# Patient Record
Sex: Female | Born: 1975 | Race: Black or African American | Hispanic: No | Marital: Single | Smoking: Never smoker
Health system: Southern US, Community
[De-identification: ages and names within clinical notes are randomized; demographics above are authoritative.]

## PROBLEM LIST (undated history)

## (undated) DIAGNOSIS — J45909 Unspecified asthma, uncomplicated: Secondary | ICD-10-CM

## (undated) HISTORY — PX: TUBAL LIGATION: SHX77

---

## 1997-09-10 ENCOUNTER — Other Ambulatory Visit: Admission: RE | Admit: 1997-09-10 | Discharge: 1997-09-10 | Payer: Self-pay | Admitting: Obstetrics

## 1998-02-05 ENCOUNTER — Emergency Department (HOSPITAL_COMMUNITY): Admission: EM | Admit: 1998-02-05 | Discharge: 1998-02-05 | Payer: Self-pay | Admitting: Emergency Medicine

## 1998-06-24 ENCOUNTER — Other Ambulatory Visit: Admission: RE | Admit: 1998-06-24 | Discharge: 1998-06-24 | Payer: Self-pay | Admitting: Obstetrics

## 1998-12-09 ENCOUNTER — Other Ambulatory Visit: Admission: RE | Admit: 1998-12-09 | Discharge: 1998-12-09 | Payer: Self-pay | Admitting: Obstetrics

## 1999-01-12 ENCOUNTER — Other Ambulatory Visit: Admission: RE | Admit: 1999-01-12 | Discharge: 1999-01-12 | Payer: Self-pay

## 1999-12-28 ENCOUNTER — Other Ambulatory Visit: Admission: RE | Admit: 1999-12-28 | Discharge: 1999-12-28 | Payer: Self-pay | Admitting: Obstetrics

## 2000-10-27 ENCOUNTER — Inpatient Hospital Stay (HOSPITAL_COMMUNITY): Admission: AD | Admit: 2000-10-27 | Discharge: 2000-10-27 | Payer: Self-pay | Admitting: Obstetrics

## 2001-03-02 ENCOUNTER — Emergency Department (HOSPITAL_COMMUNITY): Admission: EM | Admit: 2001-03-02 | Discharge: 2001-03-02 | Payer: Self-pay | Admitting: Emergency Medicine

## 2001-03-08 ENCOUNTER — Emergency Department (HOSPITAL_COMMUNITY): Admission: EM | Admit: 2001-03-08 | Discharge: 2001-03-09 | Payer: Self-pay | Admitting: Emergency Medicine

## 2001-04-05 ENCOUNTER — Ambulatory Visit (HOSPITAL_COMMUNITY): Admission: RE | Admit: 2001-04-05 | Discharge: 2001-04-05 | Payer: Self-pay | Admitting: *Deleted

## 2001-05-07 ENCOUNTER — Encounter: Payer: Self-pay | Admitting: Obstetrics

## 2001-05-07 ENCOUNTER — Ambulatory Visit (HOSPITAL_COMMUNITY): Admission: RE | Admit: 2001-05-07 | Discharge: 2001-05-07 | Payer: Self-pay | Admitting: Obstetrics

## 2001-05-24 ENCOUNTER — Inpatient Hospital Stay (HOSPITAL_COMMUNITY): Admission: AD | Admit: 2001-05-24 | Discharge: 2001-05-24 | Payer: Self-pay | Admitting: Obstetrics

## 2001-08-24 ENCOUNTER — Inpatient Hospital Stay (HOSPITAL_COMMUNITY): Admission: AD | Admit: 2001-08-24 | Discharge: 2001-08-24 | Payer: Self-pay | Admitting: Obstetrics

## 2001-08-25 ENCOUNTER — Inpatient Hospital Stay (HOSPITAL_COMMUNITY): Admission: AD | Admit: 2001-08-25 | Discharge: 2001-08-25 | Payer: Self-pay | Admitting: Obstetrics

## 2001-09-16 ENCOUNTER — Inpatient Hospital Stay (HOSPITAL_COMMUNITY): Admission: AD | Admit: 2001-09-16 | Discharge: 2001-09-16 | Payer: Self-pay | Admitting: Obstetrics

## 2001-10-07 ENCOUNTER — Encounter (INDEPENDENT_AMBULATORY_CARE_PROVIDER_SITE_OTHER): Payer: Self-pay | Admitting: Specialist

## 2001-10-07 ENCOUNTER — Inpatient Hospital Stay (HOSPITAL_COMMUNITY): Admission: AD | Admit: 2001-10-07 | Discharge: 2001-10-09 | Payer: Self-pay | Admitting: Obstetrics

## 2003-09-21 ENCOUNTER — Inpatient Hospital Stay (HOSPITAL_COMMUNITY): Admission: AD | Admit: 2003-09-21 | Discharge: 2003-09-21 | Payer: Self-pay | Admitting: Obstetrics

## 2004-07-14 ENCOUNTER — Ambulatory Visit (HOSPITAL_COMMUNITY): Admission: RE | Admit: 2004-07-14 | Discharge: 2004-07-14 | Payer: Self-pay | Admitting: Internal Medicine

## 2004-08-22 ENCOUNTER — Other Ambulatory Visit: Admission: RE | Admit: 2004-08-22 | Discharge: 2004-08-22 | Payer: Self-pay | Admitting: Obstetrics and Gynecology

## 2006-03-08 ENCOUNTER — Ambulatory Visit (HOSPITAL_COMMUNITY): Admission: RE | Admit: 2006-03-08 | Discharge: 2006-03-08 | Payer: Self-pay | Admitting: Pulmonary Disease

## 2006-12-24 ENCOUNTER — Encounter: Admission: RE | Admit: 2006-12-24 | Discharge: 2006-12-24 | Payer: Self-pay | Admitting: Orthopedic Surgery

## 2007-04-30 ENCOUNTER — Emergency Department (HOSPITAL_COMMUNITY): Admission: EM | Admit: 2007-04-30 | Discharge: 2007-04-30 | Payer: Self-pay | Admitting: Emergency Medicine

## 2007-09-06 ENCOUNTER — Emergency Department (HOSPITAL_COMMUNITY): Admission: EM | Admit: 2007-09-06 | Discharge: 2007-09-06 | Payer: Self-pay | Admitting: Emergency Medicine

## 2007-09-25 ENCOUNTER — Emergency Department (HOSPITAL_COMMUNITY): Admission: EM | Admit: 2007-09-25 | Discharge: 2007-09-25 | Payer: Self-pay | Admitting: Emergency Medicine

## 2007-11-05 ENCOUNTER — Encounter: Admission: RE | Admit: 2007-11-05 | Discharge: 2007-11-05 | Payer: Self-pay | Admitting: Orthopedic Surgery

## 2009-11-02 ENCOUNTER — Encounter: Admission: RE | Admit: 2009-11-02 | Discharge: 2009-11-02 | Payer: Self-pay | Admitting: Obstetrics

## 2010-03-04 ENCOUNTER — Emergency Department (HOSPITAL_COMMUNITY): Admission: EM | Admit: 2010-03-04 | Discharge: 2010-03-04 | Payer: Self-pay | Admitting: Emergency Medicine

## 2010-03-11 ENCOUNTER — Encounter: Admission: RE | Admit: 2010-03-11 | Discharge: 2010-03-11 | Payer: Self-pay | Admitting: Orthopedic Surgery

## 2010-03-16 ENCOUNTER — Encounter: Admission: RE | Admit: 2010-03-16 | Discharge: 2010-03-16 | Payer: Self-pay | Admitting: Orthopedic Surgery

## 2010-03-29 ENCOUNTER — Encounter
Admission: RE | Admit: 2010-03-29 | Discharge: 2010-06-11 | Payer: Self-pay | Source: Home / Self Care | Attending: Orthopedic Surgery | Admitting: Orthopedic Surgery

## 2010-05-27 ENCOUNTER — Encounter
Admission: RE | Admit: 2010-05-27 | Discharge: 2010-05-27 | Payer: Self-pay | Source: Home / Self Care | Attending: Pulmonary Disease | Admitting: Pulmonary Disease

## 2010-06-11 ENCOUNTER — Encounter: Admission: RE | Admit: 2010-06-11 | Payer: Self-pay | Source: Home / Self Care | Admitting: Orthopedic Surgery

## 2010-07-02 ENCOUNTER — Encounter: Payer: Self-pay | Admitting: Internal Medicine

## 2010-07-03 ENCOUNTER — Encounter: Payer: Self-pay | Admitting: Pulmonary Disease

## 2010-10-28 NOTE — Discharge Summary (Signed)
[facility]  Patient:    Brandi Meyer, Brandi Meyer Visit Number: [id number] MRN: [id number]          Service Type: OBS Location: 910A 9120 01 Attending Physician:  Venita Sheffield Dictated by:   Kathreen Cosier, M.D. Admit Date:  10/07/2001 Discharge Date: 10/09/2001                             Discharge Summary  HISTORY:                      The patient is a 35 year old gravida 3, para 2, EDC Oct 12, 2001, who was admitted in labor and had a normal vaginal delivery of a female patient.  She desired sterilization on April 29, underwent a postpartum tubal ligation.  She did well and was discharged home on the second postpartum day, April 30, ambulatory on a regular diet on Tylenol #3. Hemoglobin was 11.6.  She is to see me in six weeks.  DISCHARGE DIAGNOSES:          1. Status post normal vaginal delivery at term.                               2. Postpartum tubal ligation. Dictated by:   Kathreen Cosier, M.D. Attending Physician:  Venita Sheffield DD:  10/09/01 TD:  10/10/01 Job: [id number]

## 2010-10-28 NOTE — Op Note (Signed)
[facility]  Patient:    Brandi Meyer, Brandi Meyer Visit Number: [id number] MRN: [id number]          Service Type: OBS Location: 910A 9120 01 Attending Physician:  Venita Sheffield Dictated by:   Kathreen Cosier, M.D. Admit Date:  10/07/2001                             Operative Report  PREOPERATIVE DIAGNOSES:       Multiparity.  PROCEDURE:                    Postpartum tubal ligation.  Under general anesthesia with patient in supine position, abdomen prepped and draped. Bladder emptied with straight catheter.  Midline subumbilical incision 1 inch long made, carried down to the rectus fascia.  Fascia cleaned and incised the length of the incision.  Fascia grasped with two Kochers.  Fascia and peritoneum opened with Mayo scissors.  The right tube was grasped in the mid portion with Babcock clamp and the tube traced to the fimbria.  Plain 0 suture placed in the mesosalpinx below the portion of tube in the clamp.  This was tied and approximately 1 inch of tube transected.  Procedure done in exact fashion on the other side.  Hemostasis satisfactory.  Abdomen closed in layers.  Peritoneum and fascia continuous suture of 0 Dexon.  Skin closed with subcuticular stitch of 3-0 plain.  Blood loss less than 5 cc.  Patient tolerated procedure well.  Taken to recovery room in good condition. Dictated by:   Kathreen Cosier, M.D. Attending Physician:  Venita Sheffield DD:  10/08/01 TD:  10/08/01 Job: [id number]

## 2011-03-21 LAB — CBC
HCT: 33.6 — ABNORMAL LOW
Hemoglobin: 11.4 — ABNORMAL LOW
MCV: 93.1
RDW: 12.4
WBC: 4.5

## 2011-03-21 LAB — DIFFERENTIAL
Basophils Relative: 1
Eosinophils Absolute: 0.1 — ABNORMAL LOW
Eosinophils Relative: 2
Monocytes Absolute: 0.2
Monocytes Relative: 5
Neutrophils Relative %: 71

## 2011-03-21 LAB — BASIC METABOLIC PANEL
BUN: 7
CO2: 28
Calcium: 8.6
Chloride: 104
Creatinine, Ser: 0.8
Glucose, Bld: 98

## 2011-05-25 ENCOUNTER — Other Ambulatory Visit: Payer: Self-pay | Admitting: Obstetrics

## 2011-05-25 DIAGNOSIS — Z1231 Encounter for screening mammogram for malignant neoplasm of breast: Secondary | ICD-10-CM

## 2011-06-19 ENCOUNTER — Ambulatory Visit: Payer: Self-pay

## 2011-06-20 ENCOUNTER — Ambulatory Visit
Admission: RE | Admit: 2011-06-20 | Discharge: 2011-06-20 | Disposition: A | Discharge status: Home / Self Care | Payer: Medicaid Other | Source: Ambulatory Visit | Attending: Obstetrics | Admitting: Obstetrics

## 2011-06-20 DIAGNOSIS — Z1231 Encounter for screening mammogram for malignant neoplasm of breast: Secondary | ICD-10-CM

## 2011-07-30 IMAGING — CR DG LUMBAR SPINE COMPLETE 4+V
5 series · 5 of 5 positions shown · non-contrast
Comparison: None

CLINICAL DATA: Lumbar strain.  Fall 1 week ago.

LUMBAR SPINE - COMPLETE 4+ VIEW

[view not recorded (1 of 5)]
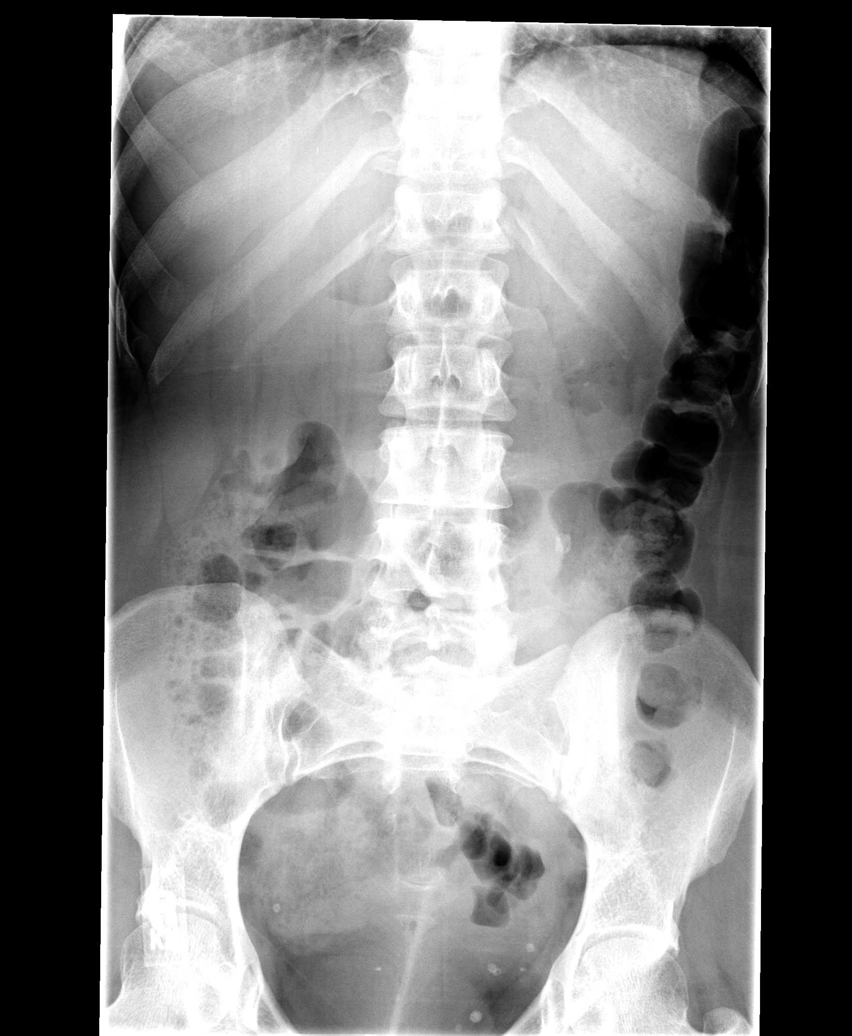

[view not recorded (2 of 5)]
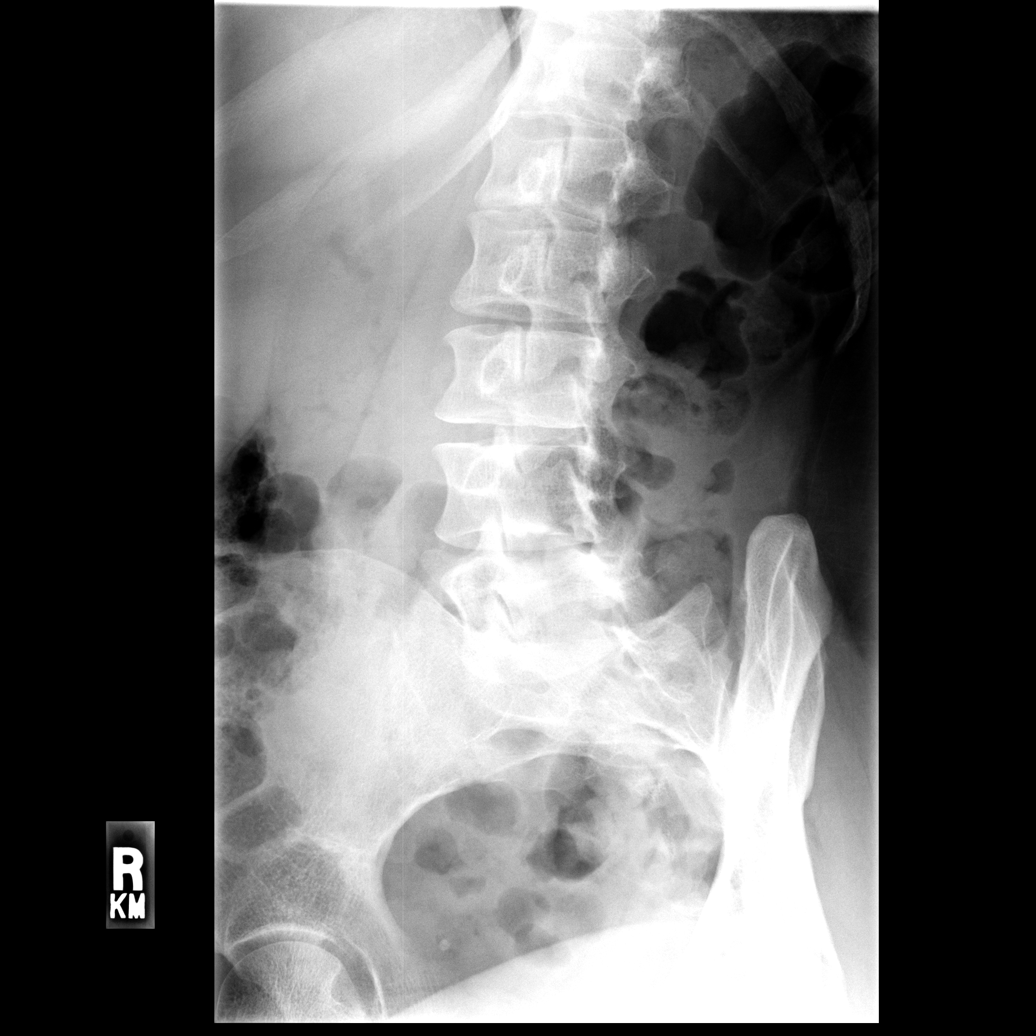

[view not recorded (3 of 5)]
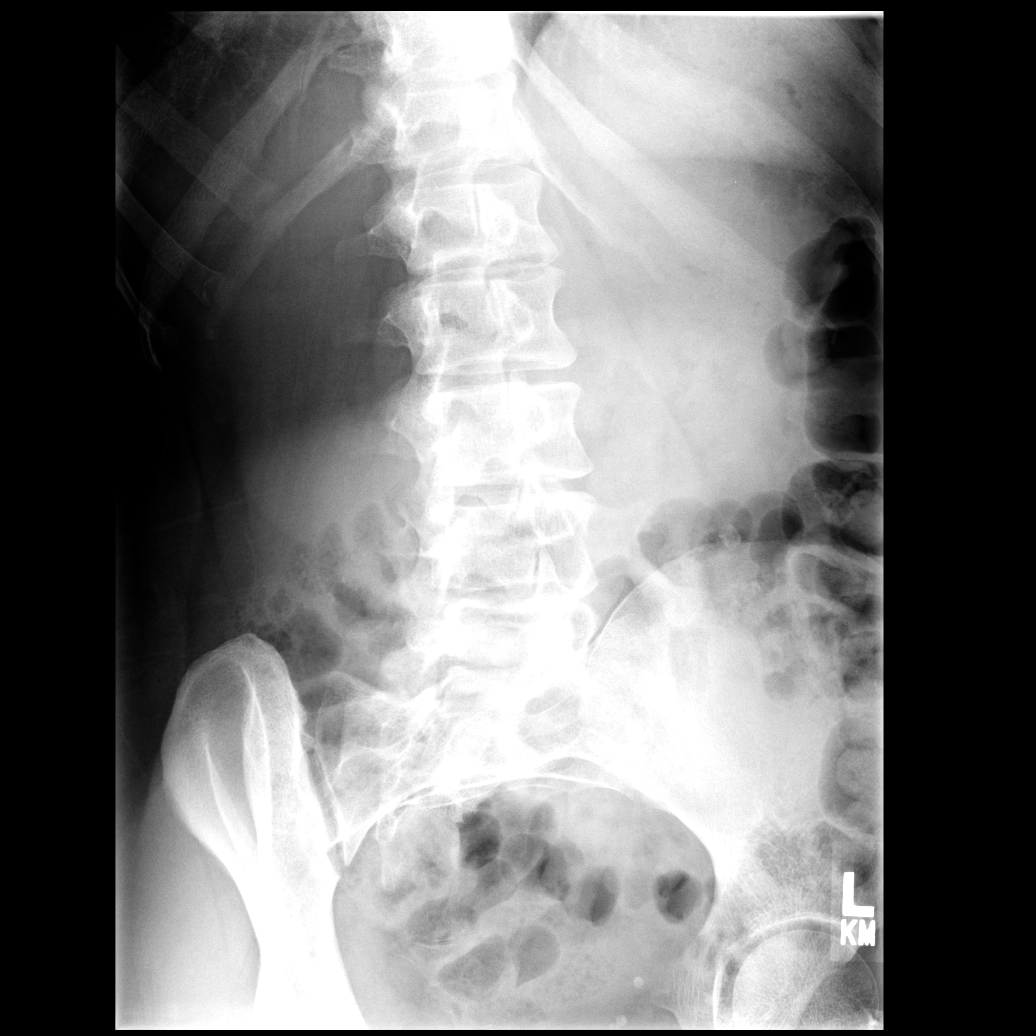

[view not recorded (4 of 5)]
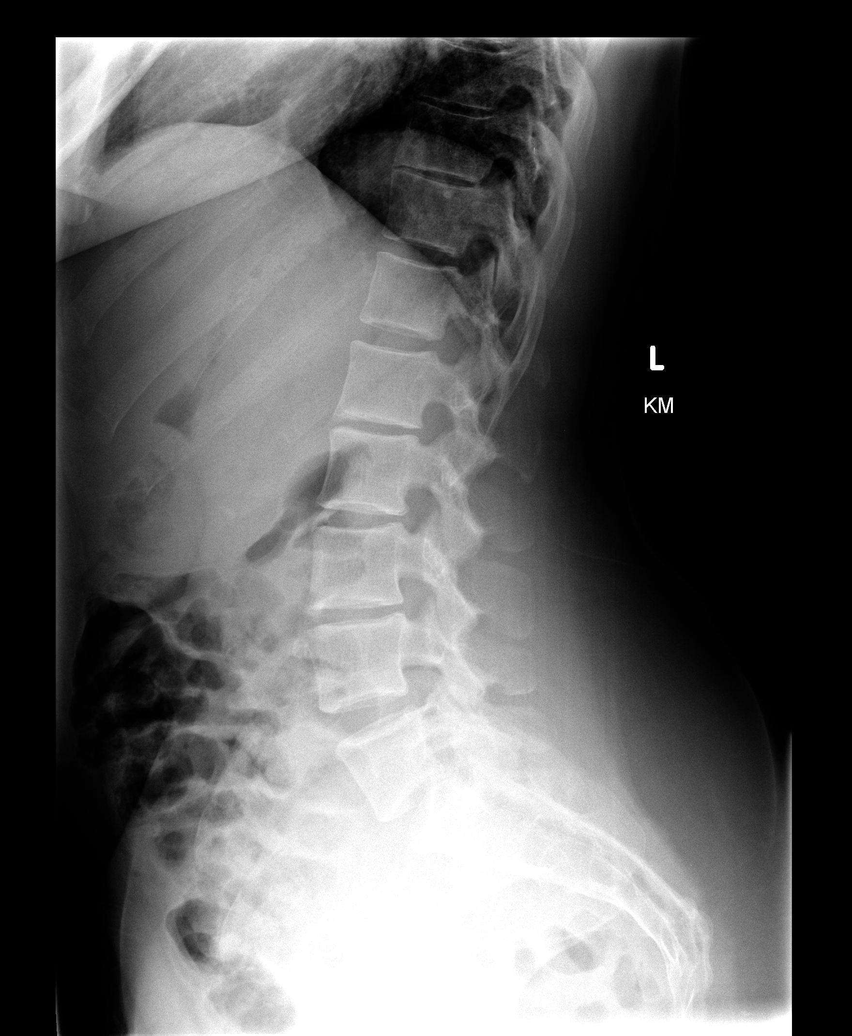

[view not recorded (5 of 5)]
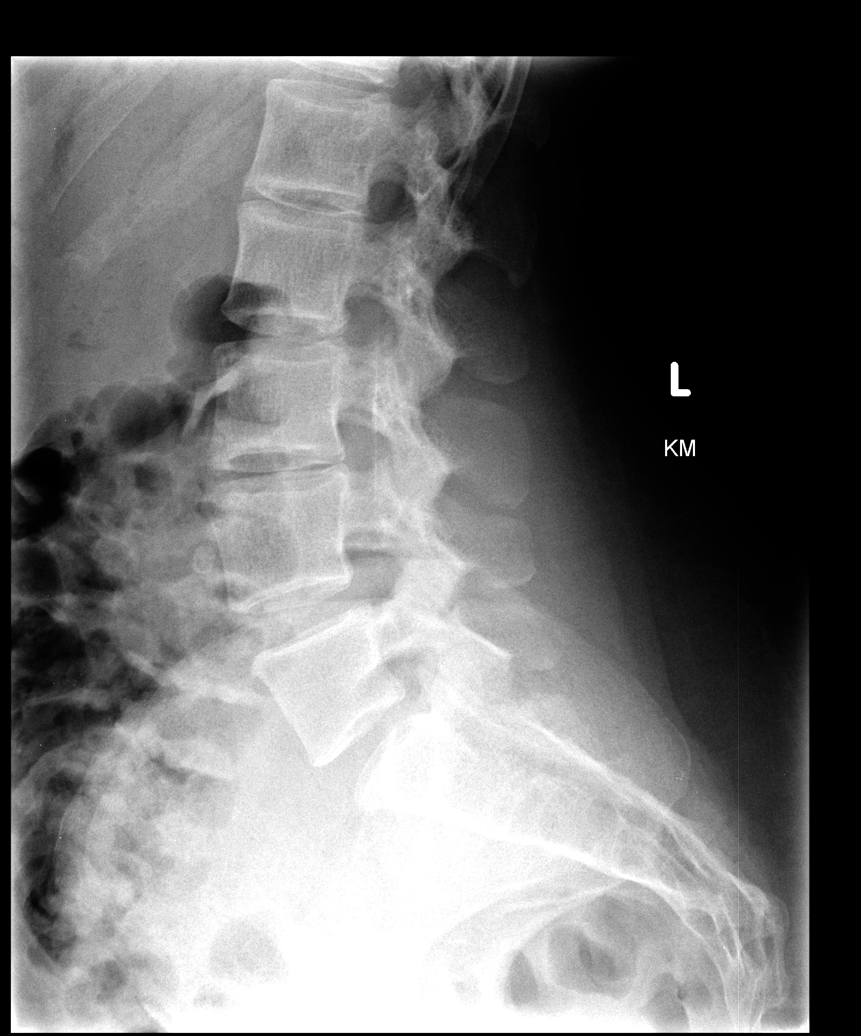

[5 of 5 positions shown; findings below may reference images not displayed]

FINDINGS: Lumbar alignment is anatomic.  Negative for fracture.
Mild to moderate disc degeneration at L1-2 and L2-3.  Disc
degeneration and mild spurring at L3-4.  Negative for pars defect.
IMPRESSION: Degenerative disc disease.  Negative for fracture.

## 2012-04-04 ENCOUNTER — Emergency Department (HOSPITAL_COMMUNITY)
Admission: EM | Admit: 2012-04-04 | Discharge: 2012-04-04 | Disposition: A | Discharge status: Home / Self Care | Payer: Medicaid Other | Attending: Emergency Medicine | Admitting: Emergency Medicine

## 2012-04-04 ENCOUNTER — Encounter (HOSPITAL_COMMUNITY): Payer: Self-pay | Admitting: *Deleted

## 2012-04-04 ENCOUNTER — Emergency Department (HOSPITAL_COMMUNITY): Payer: Medicaid Other

## 2012-04-04 DIAGNOSIS — S92353A Displaced fracture of fifth metatarsal bone, unspecified foot, initial encounter for closed fracture: Secondary | ICD-10-CM

## 2012-04-04 DIAGNOSIS — Y9301 Activity, walking, marching and hiking: Secondary | ICD-10-CM | POA: Insufficient documentation

## 2012-04-04 DIAGNOSIS — S92309A Fracture of unspecified metatarsal bone(s), unspecified foot, initial encounter for closed fracture: Secondary | ICD-10-CM | POA: Insufficient documentation

## 2012-04-04 DIAGNOSIS — R296 Repeated falls: Secondary | ICD-10-CM | POA: Insufficient documentation

## 2012-04-04 DIAGNOSIS — Y9289 Other specified places as the place of occurrence of the external cause: Secondary | ICD-10-CM | POA: Insufficient documentation

## 2012-04-04 DIAGNOSIS — Z791 Long term (current) use of non-steroidal anti-inflammatories (NSAID): Secondary | ICD-10-CM | POA: Insufficient documentation

## 2012-04-04 DIAGNOSIS — Z79899 Other long term (current) drug therapy: Secondary | ICD-10-CM | POA: Insufficient documentation

## 2012-04-04 MED ORDER — ACETAMINOPHEN-CODEINE #3 300-30 MG PO TABS: 1.0000 | ORAL_TABLET | Freq: Four times a day (QID) | ORAL | Status: DC | PRN

## 2012-04-04 MED ORDER — ACETAMINOPHEN 325 MG PO TABS
650.0000 mg | ORAL_TABLET | Freq: Once | ORAL | Status: AC
  Administered 2012-04-04: 650 mg via ORAL
  Filled 2012-04-04: qty 2

## 2012-04-04 NOTE — Progress Notes (Deleted)
[facility] ED CM noted no pcp nor coverage CM spoke with pt and female family at bedside who states pt recently (less than 6 months) moved to [place]. Pt states she will be obtaining insurance coverage soon through her job at [employer] Encouraged and discussed with pt how to get an in network pcp (to prevent extra fees for using out of network provider) Pt voiced understanding and appreciation of information provided  

## 2012-04-04 NOTE — Progress Notes (Signed)
Pt confirm pcp is george kilpatrick and ortho provider is Dr Montez Morita
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
## 2012-04-04 NOTE — ED Notes (Signed)
Pt states she was talking to someone today and then went to walk away and her feet just gave out on her, denies falling to ground, states "my feet caught me whole body". Pt states unable to apply pressure on L foot, knot to outter side of foot, states having shooting pain from pinkie toe up 10/10. Pt did arrive w/ crutches.

## 2012-04-04 NOTE — Progress Notes (Signed)
ED CM went to visit pt Not present ED testing

## 2012-07-09 ENCOUNTER — Ambulatory Visit
Admission: RE | Admit: 2012-07-09 | Discharge: 2012-07-09 | Disposition: A | Discharge status: Home / Self Care | Payer: Self-pay | Source: Ambulatory Visit | Attending: Orthopedic Surgery | Admitting: Orthopedic Surgery

## 2012-07-09 ENCOUNTER — Other Ambulatory Visit: Payer: Self-pay | Admitting: Orthopedic Surgery

## 2012-07-09 DIAGNOSIS — T148XXA Other injury of unspecified body region, initial encounter: Secondary | ICD-10-CM

## 2012-12-02 ENCOUNTER — Other Ambulatory Visit: Payer: Self-pay | Admitting: Obstetrics

## 2012-12-02 DIAGNOSIS — N63 Unspecified lump in unspecified breast: Secondary | ICD-10-CM

## 2012-12-12 ENCOUNTER — Ambulatory Visit
Admission: RE | Admit: 2012-12-12 | Discharge: 2012-12-12 | Disposition: A | Discharge status: Home / Self Care | Payer: Medicaid Other | Source: Ambulatory Visit | Attending: Obstetrics | Admitting: Obstetrics

## 2012-12-12 DIAGNOSIS — N63 Unspecified lump in unspecified breast: Secondary | ICD-10-CM

## 2013-05-20 ENCOUNTER — Other Ambulatory Visit: Payer: Self-pay | Admitting: Obstetrics

## 2013-05-20 DIAGNOSIS — N63 Unspecified lump in unspecified breast: Secondary | ICD-10-CM

## 2013-06-16 ENCOUNTER — Other Ambulatory Visit: Payer: Self-pay | Admitting: Obstetrics

## 2013-06-16 ENCOUNTER — Ambulatory Visit
Admission: RE | Admit: 2013-06-16 | Discharge: 2013-06-16 | Disposition: A | Discharge status: Home / Self Care | Payer: Medicaid Other | Source: Ambulatory Visit | Attending: Obstetrics | Admitting: Obstetrics

## 2013-06-16 DIAGNOSIS — N63 Unspecified lump in unspecified breast: Secondary | ICD-10-CM

## 2013-06-16 DIAGNOSIS — R921 Mammographic calcification found on diagnostic imaging of breast: Secondary | ICD-10-CM

## 2013-11-30 ENCOUNTER — Emergency Department (HOSPITAL_COMMUNITY)
Admission: EM | Admit: 2013-11-30 | Discharge: 2013-12-01 | Disposition: A | Discharge status: Home / Self Care | Payer: Medicaid Other | Attending: Emergency Medicine | Admitting: Emergency Medicine

## 2013-11-30 ENCOUNTER — Emergency Department (HOSPITAL_COMMUNITY): Payer: Medicaid Other

## 2013-11-30 ENCOUNTER — Encounter (HOSPITAL_COMMUNITY): Payer: Self-pay | Admitting: Emergency Medicine

## 2013-11-30 DIAGNOSIS — M2550 Pain in unspecified joint: Secondary | ICD-10-CM | POA: Diagnosis present

## 2013-11-30 DIAGNOSIS — Z888 Allergy status to other drugs, medicaments and biological substances status: Secondary | ICD-10-CM | POA: Insufficient documentation

## 2013-11-30 DIAGNOSIS — R110 Nausea: Secondary | ICD-10-CM | POA: Diagnosis not present

## 2013-11-30 DIAGNOSIS — Y939 Activity, unspecified: Secondary | ICD-10-CM | POA: Diagnosis not present

## 2013-11-30 DIAGNOSIS — R209 Unspecified disturbances of skin sensation: Secondary | ICD-10-CM | POA: Insufficient documentation

## 2013-11-30 DIAGNOSIS — J45909 Unspecified asthma, uncomplicated: Secondary | ICD-10-CM | POA: Insufficient documentation

## 2013-11-30 DIAGNOSIS — Z880 Allergy status to penicillin: Secondary | ICD-10-CM | POA: Diagnosis not present

## 2013-11-30 DIAGNOSIS — R42 Dizziness and giddiness: Secondary | ICD-10-CM | POA: Diagnosis not present

## 2013-11-30 DIAGNOSIS — M542 Cervicalgia: Secondary | ICD-10-CM

## 2013-11-30 DIAGNOSIS — M62838 Other muscle spasm: Secondary | ICD-10-CM

## 2013-11-30 HISTORY — DX: Unspecified asthma, uncomplicated: J45.909

## 2013-11-30 MED ORDER — KETOROLAC TROMETHAMINE 60 MG/2ML IM SOLN
60.0000 mg | Freq: Once | INTRAMUSCULAR | Status: AC
Start: 2013-11-30 — End: 2013-11-30
  Administered 2013-11-30: 60 mg via INTRAMUSCULAR
  Filled 2013-11-30: qty 2

## 2013-11-30 MED ORDER — ONDANSETRON 8 MG PO TBDP
8.0000 mg | ORAL_TABLET | Freq: Once | ORAL | Status: AC
  Administered 2013-11-30: 8 mg via ORAL
  Filled 2013-11-30: qty 1

## 2013-11-30 MED ORDER — DIAZEPAM 5 MG PO TABS
5.0000 mg | ORAL_TABLET | Freq: Once | ORAL | Status: AC
  Administered 2013-11-30: 5 mg via ORAL
  Filled 2013-11-30: qty 1

## 2013-11-30 MED ORDER — OXYCODONE-ACETAMINOPHEN 5-325 MG PO TABS
2.0000 | ORAL_TABLET | Freq: Once | ORAL | Status: AC
  Administered 2013-11-30: 2 via ORAL
  Filled 2013-11-30: qty 2

## 2013-11-30 MED ORDER — METHOCARBAMOL 500 MG PO TABS
500.0000 mg | ORAL_TABLET | Freq: Once | ORAL | Status: AC
  Administered 2013-11-30: 500 mg via ORAL
  Filled 2013-11-30: qty 1
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
## 2013-12-01 MED ORDER — DIAZEPAM 5 MG PO TABS: 5.0000 mg | ORAL_TABLET | Freq: Two times a day (BID) | ORAL | Status: AC

## 2013-12-01 MED ORDER — OXYCODONE-ACETAMINOPHEN 5-325 MG PO TABS: 1.0000 | ORAL_TABLET | Freq: Four times a day (QID) | ORAL | Status: AC | PRN

## 2013-12-01 NOTE — Discharge Instructions (Signed)
Recommend an outpatient MRI of your neck as well as followup with neurosurgery regarding your neck pain. Wear a cervical collar at all times until cleared to remove your collar by a neurosurgeon. Recommend Percocet for pain control and Valium for muscle spasms. Followup with your primary care doctor as needed. Return to the emergency department if symptoms worsen.  Muscle Cramps and Spasms Muscle cramps and spasms occur when a muscle or muscles tighten and you have no control over this tightening (involuntary muscle contraction). They are a common problem and can develop in any muscle. The most common place is in the calf muscles of the leg. Both muscle cramps and muscle spasms are involuntary muscle contractions, but they also have differences:   Muscle cramps are sporadic and painful. They may last a few seconds to a quarter of an hour. Muscle cramps are often more forceful and last longer than muscle spasms.  Muscle spasms may or may not be painful. They may also last just a few seconds or much longer. CAUSES  It is uncommon for cramps or spasms to be due to a serious underlying problem. In many cases, the cause of cramps or spasms is unknown. Some common causes are:   Overexertion.   Overuse from repetitive motions (doing the same thing over and over).   Remaining in a certain position for a long period of time.   Improper preparation, form, or technique while performing a sport or activity.   Dehydration.   Injury.   Side effects of some medicines.   Abnormally low levels of the salts and ions in your blood (electrolytes), especially potassium and calcium. This could happen if you are taking water pills (diuretics) or you are pregnant.  Some underlying medical problems can make it more likely to develop cramps or spasms. These include, but are not limited to:   Diabetes.   Parkinson disease.   Hormone disorders, such as thyroid problems.   Alcohol abuse.    Diseases specific to muscles, joints, and bones.   Blood vessel disease where not enough blood is getting to the muscles.  HOME CARE INSTRUCTIONS   Stay well hydrated. Drink enough water and fluids to keep your urine clear or pale yellow.  It may be helpful to massage, stretch, and relax the affected muscle.  For tight or tense muscles, use a warm towel, heating pad, or hot shower water directed to the affected area.  If you are sore or have pain after a cramp or spasm, applying ice to the affected area may relieve discomfort.  Put ice in a plastic bag.  Place a towel between your skin and the bag.  Leave the ice on for 15-20 minutes, 03-04 times a day.  Medicines used to treat a known cause of cramps or spasms may help reduce their frequency or severity. Only take over-the-counter or prescription medicines as directed by your caregiver. SEEK MEDICAL CARE IF:  Your cramps or spasms get more severe, more frequent, or do not improve over time.  MAKE SURE YOU:   Understand these instructions.  Will watch your condition.  Will get help right away if you are not doing well or get worse. Document Released: 11/18/2001 Document Revised: 09/23/2012 Document Reviewed: 05/15/2012 [facility] Patient Information 2015 [place], [place]. This information is not intended to replace advice given to you by your health care provider. Make sure you discuss any questions you have with your health care provider.  Cervical Sprain A cervical sprain is an injury in  the neck in which the strong, fibrous tissues (ligaments) that connect your neck bones stretch or tear. Cervical sprains can range from mild to severe. Severe cervical sprains can cause the neck vertebrae to be unstable. This can lead to damage of the spinal cord and can result in serious nervous system problems. The amount of time it takes for a cervical sprain to get better depends on the cause and extent of the injury. Most cervical sprains  heal in 1 to 3 weeks. CAUSES  Severe cervical sprains may be caused by:   Contact sport injuries (such as from football, rugby, wrestling, hockey, auto racing, gymnastics, diving, martial arts, or boxing).   Motor vehicle collisions.   Whiplash injuries. This is an injury from a sudden forward and backward whipping movement of the head and neck.  Falls.  Mild cervical sprains may be caused by:   Being in an awkward position, such as while cradling a telephone between your ear and shoulder.   Sitting in a chair that does not offer proper support.   Working at a poorly Marketing executivedesigned computer station.   Looking up or down for long periods of time.  SYMPTOMS   Pain, soreness, stiffness, or a burning sensation in the front, back, or sides of the neck. This discomfort may develop immediately after the injury or slowly, 24 hours or more after the injury.   Pain or tenderness directly in the middle of the back of the neck.   Shoulder or upper back pain.   Limited ability to move the neck.   Headache.   Dizziness.   Weakness, numbness, or tingling in the hands or arms.   Muscle spasms.   Difficulty swallowing or chewing.   Tenderness and swelling of the neck.  DIAGNOSIS  Most of the time your health care provider can diagnose a cervical sprain by taking your history and doing a physical exam. Your health care provider will ask about previous neck injuries and any known neck problems, such as arthritis in the neck. X-rays may be taken to find out if there are any other problems, such as with the bones of the neck. Other tests, such as a CT scan or MRI, may also be needed.  TREATMENT  Treatment depends on the severity of the cervical sprain. Mild sprains can be treated with rest, keeping the neck in place (immobilization), and pain medicines. Severe cervical sprains are immediately immobilized. Further treatment is done to help with pain, muscle spasms, and other  symptoms and may include:  Medicines, such as pain relievers, numbing medicines, or muscle relaxants.   Physical therapy. This may involve stretching exercises, strengthening exercises, and posture training. Exercises and improved posture can help stabilize the neck, strengthen muscles, and help stop symptoms from returning.  HOME CARE INSTRUCTIONS   Put ice on the injured area.   Put ice in a plastic bag.   Place a towel between your skin and the bag.   Leave the ice on for 15-20 minutes, 3-4 times a day.   If your injury was severe, you may have been given a cervical collar to wear. A cervical collar is a two-piece collar designed to keep your neck from moving while it heals.  Do not remove the collar unless instructed by your health care provider.  If you have long hair, keep it outside of the collar.  Ask your health care provider before making any adjustments to your collar. Minor adjustments may be required over time to improve  comfort and reduce pressure on your chin or on the back of your head.  Ifyou are allowed to remove the collar for cleaning or bathing, follow your health care provider's instructions on how to do so safely.  Keep your collar clean by wiping it with mild soap and water and drying it completely. If the collar you have been given includes removable pads, remove them every 1-2 days and hand wash them with soap and water. Allow them to air dry. They should be completely dry before you wear them in the collar.  If you are allowed to remove the collar for cleaning and bathing, wash and dry the skin of your neck. Check your skin for irritation or sores. If you see any, tell your health care provider.  Do not drive while wearing the collar.   Only take over-the-counter or prescription medicines for pain, discomfort, or fever as directed by your health care provider.   Keep all follow-up appointments as directed by your health care provider.   Keep all  physical therapy appointments as directed by your health care provider.   Make any needed adjustments to your workstation to promote good posture.   Avoid positions and activities that make your symptoms worse.   Warm up and stretch before being active to help prevent problems.  SEEK MEDICAL CARE IF:   Your pain is not controlled with medicine.   You are unable to decrease your pain medicine over time as planned.   Your activity level is not improving as expected.  SEEK IMMEDIATE MEDICAL CARE IF:   You develop any bleeding.  You develop stomach upset.  You have signs of an allergic reaction to your medicine.   Your symptoms get worse.   You develop new, unexplained symptoms.   You have numbness, tingling, weakness, or paralysis in any part of your body.  MAKE SURE YOU:   Understand these instructions.  Will watch your condition.  Will get help right away if you are not doing well or get worse. Document Released: 03/26/2007 Document Revised: 06/03/2013 Document Reviewed: 12/04/2012 [facility] Patient Information 2015 [place], [place]. This information is not intended to replace advice given to you by your health care provider. Make sure you discuss any questions you have with your health care provider.
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
## 2013-12-16 ENCOUNTER — Other Ambulatory Visit: Payer: Self-pay | Admitting: Pulmonary Disease

## 2013-12-16 DIAGNOSIS — R921 Mammographic calcification found on diagnostic imaging of breast: Secondary | ICD-10-CM

## 2014-01-13 ENCOUNTER — Ambulatory Visit
Admission: RE | Admit: 2014-01-13 | Discharge: 2014-01-13 | Disposition: A | Discharge status: Home / Self Care | Payer: Medicaid Other | Source: Ambulatory Visit | Attending: Pulmonary Disease | Admitting: Pulmonary Disease

## 2014-01-13 ENCOUNTER — Encounter (INDEPENDENT_AMBULATORY_CARE_PROVIDER_SITE_OTHER): Payer: Self-pay

## 2014-01-13 DIAGNOSIS — R921 Mammographic calcification found on diagnostic imaging of breast: Secondary | ICD-10-CM

## 2014-01-20 ENCOUNTER — Other Ambulatory Visit: Payer: Self-pay | Admitting: Obstetrics

## 2014-01-20 DIAGNOSIS — N6320 Unspecified lump in the left breast, unspecified quadrant: Secondary | ICD-10-CM

## 2014-01-20 DIAGNOSIS — R921 Mammographic calcification found on diagnostic imaging of breast: Secondary | ICD-10-CM

## 2014-01-26 ENCOUNTER — Ambulatory Visit
Admission: RE | Admit: 2014-01-26 | Discharge: 2014-01-26 | Disposition: A | Discharge status: Home / Self Care | Payer: Medicaid Other | Source: Ambulatory Visit | Attending: Obstetrics | Admitting: Obstetrics

## 2014-01-26 DIAGNOSIS — R921 Mammographic calcification found on diagnostic imaging of breast: Secondary | ICD-10-CM

## 2014-01-26 DIAGNOSIS — N6320 Unspecified lump in the left breast, unspecified quadrant: Secondary | ICD-10-CM

## 2015-03-04 ENCOUNTER — Other Ambulatory Visit (HOSPITAL_COMMUNITY): Payer: Self-pay | Admitting: Obstetrics

## 2015-03-04 DIAGNOSIS — Z1231 Encounter for screening mammogram for malignant neoplasm of breast: Secondary | ICD-10-CM

## 2015-03-09 ENCOUNTER — Ambulatory Visit (HOSPITAL_COMMUNITY): Payer: Medicaid Other

## 2015-04-08 ENCOUNTER — Other Ambulatory Visit: Payer: Self-pay | Admitting: Obstetrics

## 2015-04-08 DIAGNOSIS — Z1231 Encounter for screening mammogram for malignant neoplasm of breast: Secondary | ICD-10-CM

## 2015-05-04 ENCOUNTER — Ambulatory Visit: Payer: Medicaid Other

## 2015-08-25 ENCOUNTER — Ambulatory Visit
Admission: RE | Admit: 2015-08-25 | Discharge: 2015-08-25 | Disposition: A | Discharge status: Home / Self Care | Payer: Medicaid Other | Source: Ambulatory Visit | Attending: Obstetrics | Admitting: Obstetrics

## 2015-08-25 DIAGNOSIS — Z1231 Encounter for screening mammogram for malignant neoplasm of breast: Secondary | ICD-10-CM

## 2016-02-18 LAB — CYTOLOGY - PAP

## 2016-03-19 ENCOUNTER — Encounter (HOSPITAL_COMMUNITY): Payer: Self-pay | Admitting: Emergency Medicine

## 2016-03-19 ENCOUNTER — Emergency Department (HOSPITAL_COMMUNITY)
Admission: EM | Admit: 2016-03-19 | Discharge: 2016-03-19 | Disposition: A | Discharge status: Home / Self Care | Payer: Self-pay | Attending: Emergency Medicine | Admitting: Emergency Medicine

## 2016-03-19 ENCOUNTER — Emergency Department (HOSPITAL_COMMUNITY): Payer: Self-pay

## 2016-03-19 DIAGNOSIS — J45909 Unspecified asthma, uncomplicated: Secondary | ICD-10-CM | POA: Insufficient documentation

## 2016-03-19 DIAGNOSIS — M7989 Other specified soft tissue disorders: Secondary | ICD-10-CM | POA: Insufficient documentation

## 2016-03-19 DIAGNOSIS — Z79899 Other long term (current) drug therapy: Secondary | ICD-10-CM | POA: Insufficient documentation

## 2016-03-19 DIAGNOSIS — M25561 Pain in right knee: Secondary | ICD-10-CM | POA: Insufficient documentation

## 2016-03-19 MED ORDER — IBUPROFEN 800 MG PO TABS
800.0000 mg | ORAL_TABLET | Freq: Once | ORAL | Status: AC
  Administered 2016-03-19: 800 mg via ORAL
  Filled 2016-03-19: qty 1

## 2016-03-19 MED ORDER — IBUPROFEN 800 MG PO TABS: 800.0000 mg | ORAL_TABLET | Freq: Three times a day (TID) | ORAL | 0 refills | Status: AC

## 2016-03-19 NOTE — ED Provider Notes (Signed)
[facility]-EMERGENCY DEPT Provider Note   CSN: [id number] Arrival date & time: 03/19/16  1339  By signing my name below, I, Octavia Heir, attest that this documentation has been prepared under the direction and in the presence of [employer], PA-C.  Electronically Signed: Octavia Heir, ED Scribe. 03/19/16. 3:59 PM.    History   Chief Complaint Chief Complaint  Patient presents with  . Knee Pain    The history is provided by the patient. No language interpreter was used.   HPI Comments: Brandi Meyer is a 40 y.o. female who presents to the Emergency Department complaining of sudden onset, gradual worsening, moderate, posterior right knee pain x 4 days. Pt has associated mild right knee swelling. She describes the pain as a "pinching" sensation and states it feels "stuck". Pt is unable to apply full pressure to her right knee when bearing weight. Pt reports going up and down flights of stairs at her job but states she has not had any new changes. She has a hx of injuring her right knee in the past but denies any current injury. Pt has been taking 1500 mg of tylenol to alleviate her pain with no relief. Pt denies numbness or weakness.  Past Medical History:  Diagnosis Date  . Asthma     There are no active problems to display for this patient.   Past Surgical History:  Procedure Laterality Date  . TUBAL LIGATION      OB History    No data available       Home Medications    Prior to Admission medications   Medication Sig Start Date End Date Taking? Authorizing Provider  albuterol-ipratropium (COMBIVENT) 18-103 MCG/ACT inhaler Inhale 2 puffs into the lungs every 4 (four) hours as needed.     Historical Provider, MD  cyanocobalamin 500 MCG tablet Take 500 mcg by mouth every morning.    Historical Provider, MD  diazepam (VALIUM) 5 MG tablet Take 1 tablet (5 mg total) by mouth 2 (two) times daily. 12/01/13   Antony Madura, PA-C  Ginkgo Biloba Extract 60 MG CAPS Take 60 mg by mouth  every morning.    Historical Provider, MD  ibuprofen (ADVIL,MOTRIN) 800 MG tablet Take 1 tablet (800 mg total) by mouth 3 (three) times daily. 03/19/16   Cheri Fowler, PA-C  oxyCODONE-acetaminophen (PERCOCET/ROXICET) 5-325 MG per tablet Take 1-2 tablets by mouth every 6 (six) hours as needed for moderate pain or severe pain. 12/01/13   Antony Madura, PA-C  vitamin C (ASCORBIC ACID) 500 MG tablet Take 500 mg by mouth every morning.    Historical Provider, MD    Family History History reviewed. No pertinent family history.  Social History Social History  Substance Use Topics  . Smoking status: Never Smoker  . Smokeless tobacco: Never Used  . Alcohol use No     Allergies   Aspirin and Penicillins   Review of Systems Review of Systems  Musculoskeletal: Positive for arthralgias.  Neurological: Negative for weakness and numbness.  All other systems reviewed and are negative.    Physical Exam Updated Vital Signs BP 109/72 (BP Location: Right Arm)   Pulse 73   Temp 98.8 F (37.1 C) (Oral)   Resp 18   LMP 03/16/2016   SpO2 100%   Physical Exam  Constitutional: She is oriented to person, place, and time. She appears well-developed and well-nourished.  HENT:  Head: Normocephalic and atraumatic.  Right Ear: External ear normal.  Left Ear: External ear normal.  Eyes: Conjunctivae are normal. No scleral icterus.  Neck: No tracheal deviation present.  Cardiovascular:  Pulses:      Dorsalis pedis pulses are 2+ on the right side, and 2+ on the left side.  No unilateral LE edema.   Pulmonary/Chest: Effort normal. No respiratory distress.  Abdominal: She exhibits no distension.  Musculoskeletal: Normal range of motion. She exhibits tenderness.       Right knee: She exhibits swelling. She exhibits normal range of motion, no effusion, no ecchymosis, no deformity and no erythema. Tenderness found. Medial joint line and lateral joint line tenderness noted.       Left knee: Normal.  No  ligamentous laxity.  Neurological: She is alert and oriented to person, place, and time.  Normal strength and sensation throughout b/l lower extremities.   Skin: Skin is warm and dry.  Psychiatric: She has a normal mood and affect. Her behavior is normal.  Nursing note and vitals reviewed.    ED Treatments / Results  DIAGNOSTIC STUDIES: Oxygen Saturation is 100% on RA, normal by my interpretation.  COORDINATION OF CARE:  3:58 PM Will order imaging of right knee. Discussed treatment plan with pt at bedside and pt agreed to plan.  Labs (all labs ordered are listed, but only abnormal results are displayed) Labs Reviewed - No data to display  EKG  EKG Interpretation None       Radiology Dg Knee Complete 4 Views Right  Result Date: 03/19/2016 CLINICAL DATA:  Right knee pain for 4 days. EXAM: RIGHT KNEE - COMPLETE 4+ VIEW COMPARISON:  None. FINDINGS: No evidence of fracture, dislocation, or joint effusion. No evidence of arthropathy or other focal bone abnormality. Soft tissues are unremarkable. IMPRESSION: Negative. Electronically Signed   By: Ted Mcalpineobrinka  Dimitrova M.D.   On: 03/19/2016 16:44    Procedures Procedures (including critical care time)  Medications Ordered in ED Medications  ibuprofen (ADVIL,MOTRIN) tablet 800 mg (800 mg Oral Given 03/19/16 1611)     Initial Impression / Assessment and Plan / ED Course  I have reviewed the triage vital signs and the nursing notes.  Pertinent labs & imaging results that were available during my care of the patient were reviewed by me and considered in my medical decision making (see chart for details).  Clinical Course   Patient X-Ray negative for obvious fracture or dislocation.  Pt advised to follow up with orthopedics. Patient given knee immobilizer and crutches while in ED, conservative therapy recommended and discussed. Patient will be discharged home & is agreeable with above plan. Returns precautions discussed. Pt appears  safe for discharge.  I personally performed the services described in this documentation, which was scribed in my presence. The recorded information has been reviewed and is accurate.  Final Clinical Impressions(s) / ED Diagnoses   Final diagnoses:  Acute pain of right knee    New Prescriptions New Prescriptions   IBUPROFEN (ADVIL,MOTRIN) 800 MG TABLET    Take 1 tablet (800 mg total) by mouth 3 (three) times daily.     Cheri FowlerKayla Kyndle Schlender, PA-C 03/19/16 1715    Benjiman CoreNathan Pickering, MD 03/19/16 [id number]

## 2016-03-19 NOTE — ED Triage Notes (Signed)
Pt c/o right knee pain, unable to bear weight, decreased ROM to right knee. Able to bear weight. No injury.

## 2017-09-17 ENCOUNTER — Other Ambulatory Visit: Payer: Self-pay | Admitting: Pulmonary Disease

## 2017-09-17 DIAGNOSIS — Z1231 Encounter for screening mammogram for malignant neoplasm of breast: Secondary | ICD-10-CM

## 2017-10-24 ENCOUNTER — Ambulatory Visit: Payer: Self-pay

## 2017-11-06 ENCOUNTER — Ambulatory Visit: Payer: Self-pay

## 2018-01-15 ENCOUNTER — Ambulatory Visit: Payer: Self-pay | Admitting: Nurse Practitioner

## 2018-01-15 DIAGNOSIS — Z111 Encounter for screening for respiratory tuberculosis: Secondary | ICD-10-CM

## 2018-01-15 NOTE — Progress Notes (Signed)
Patient presents for PPD placement for  Denies previous positive TB test  Denies known exposure to TB   Tuberculin skin test applied to right forearm.  Patient informed to return to [facility] in 48-72 hours for PPD read. Vaccine Information Statement provided to patient.    

## 2018-01-17 LAB — TB SKIN TEST
Induration: 0 mm
TB SKIN TEST: NEGATIVE

## 2020-02-09 ENCOUNTER — Emergency Department (HOSPITAL_COMMUNITY): Payer: HRSA Program

## 2020-02-09 ENCOUNTER — Emergency Department (HOSPITAL_COMMUNITY)
Admission: EM | Admit: 2020-02-09 | Discharge: 2020-02-09 | Disposition: A | Discharge status: Home / Self Care | Payer: HRSA Program | Attending: Emergency Medicine | Admitting: Emergency Medicine

## 2020-02-09 ENCOUNTER — Encounter (HOSPITAL_COMMUNITY): Payer: Self-pay

## 2020-02-09 DIAGNOSIS — J029 Acute pharyngitis, unspecified: Secondary | ICD-10-CM | POA: Diagnosis not present

## 2020-02-09 DIAGNOSIS — J45909 Unspecified asthma, uncomplicated: Secondary | ICD-10-CM | POA: Diagnosis not present

## 2020-02-09 DIAGNOSIS — Z79899 Other long term (current) drug therapy: Secondary | ICD-10-CM | POA: Insufficient documentation

## 2020-02-09 DIAGNOSIS — R079 Chest pain, unspecified: Secondary | ICD-10-CM | POA: Diagnosis present

## 2020-02-09 DIAGNOSIS — U071 COVID-19: Secondary | ICD-10-CM | POA: Diagnosis not present

## 2020-02-09 LAB — CBC
HCT: 38.4 % (ref 36.0–46.0)
Hemoglobin: 12.5 g/dL (ref 12.0–15.0)
MCH: 31.5 pg (ref 26.0–34.0)
MCHC: 32.6 g/dL (ref 30.0–36.0)
MCV: 96.7 fL (ref 80.0–100.0)
Platelets: 289 10*3/uL (ref 150–400)
RBC: 3.97 MIL/uL (ref 3.87–5.11)
RDW: 12.5 % (ref 11.5–15.5)
WBC: 3.4 10*3/uL — ABNORMAL LOW (ref 4.0–10.5)
nRBC: 0 % (ref 0.0–0.2)

## 2020-02-09 LAB — BASIC METABOLIC PANEL
Anion gap: 8 (ref 5–15)
BUN: 12 mg/dL (ref 6–20)
CO2: 23 mmol/L (ref 22–32)
Calcium: 8.8 mg/dL — ABNORMAL LOW (ref 8.9–10.3)
Chloride: 105 mmol/L (ref 98–111)
Creatinine, Ser: 0.77 mg/dL (ref 0.44–1.00)
GFR calc Af Amer: >60 mL/min (ref >60)
GFR calc non Af Amer: >60 mL/min (ref >60)
Glucose, Bld: 86 mg/dL (ref 70–99)
Potassium: 4.1 mmol/L (ref 3.5–5.1)
Sodium: 136 mmol/L (ref 135–145)

## 2020-02-09 LAB — TROPONIN I (HIGH SENSITIVITY): Troponin I (High Sensitivity): <2 ng/L (ref <18)

## 2020-02-09 LAB — SARS CORONAVIRUS 2 BY RT PCR (HOSPITAL ORDER, PERFORMED IN ~~LOC~~ HOSPITAL LAB): SARS Coronavirus 2: POSITIVE — AB

## 2020-02-09 MED ORDER — FLUTICASONE-SALMETEROL 100-50 MCG/DOSE IN AEPB: 1.0000 | INHALATION_SPRAY | Freq: Two times a day (BID) | RESPIRATORY_TRACT | 0 refills | Status: AC

## 2020-02-09 MED ORDER — ALBUTEROL SULFATE HFA 108 (90 BASE) MCG/ACT IN AERS
2.0000 | INHALATION_SPRAY | Freq: Once | RESPIRATORY_TRACT | Status: AC
  Administered 2020-02-09: 2 via RESPIRATORY_TRACT
  Filled 2020-02-09: qty 6.7

## 2020-02-09 MED ORDER — IPRATROPIUM-ALBUTEROL 0.5-2.5 (3) MG/3ML IN SOLN: 3.0000 mL | Freq: Once | RESPIRATORY_TRACT | Status: DC

## 2020-02-09 MED ORDER — COMBIVENT RESPIMAT 20-100 MCG/ACT IN AERS: 1.0000 | INHALATION_SPRAY | Freq: Four times a day (QID) | RESPIRATORY_TRACT | 0 refills | Status: AC
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
## 2020-02-09 NOTE — ED Provider Notes (Signed)
[facility]-EMERGENCY DEPT Provider Note   CSN: [id number] Arrival date & time: 02/09/20  1317     History Chief Complaint  Patient presents with  . Chest Pain  . Shortness of Breath    [facility] is a 44 y.o. female who presents to the ED today with multiple complaints including intermittent chest pain, shortness of breath, a burning sensation in her throat, and headaches for the past week. Pt reports she was recently exposed to "mold" in her home last week - she states that for some time she has been noticing "white specks" on her furniture and floors however assumed it was from her popcorn ceilings. She states that last week she returned home from work and found all over her belongings covered in "mold." She has since left the home and has not returned however states that since then she has been having these symptoms and is concerned. She does report hx of asthma however has not had an asthma exacerbation for multiple years - pt does not have an inhaler. She states this feels different to previous asthma exacerbations. Pt also reports hx of headaches however states this feels different as well. No headache currently. No worst headache of life. Pt denies fevers, chills, diaphoresis, leg swelling, palpitations, nausea, vomiting, rash, neck stiffness, or any other associated symptoms. She is unvaccinated for COVID 19.   The history is provided by the patient and medical records.       Past Medical History:  Diagnosis Date  . Asthma     There are no problems to display for this patient.   Past Surgical History:  Procedure Laterality Date  . TUBAL LIGATION       OB History   No obstetric history on file.     History reviewed. No pertinent family history.  Social History   Tobacco Use  . Smoking status: Never Smoker  . Smokeless tobacco: Never Used  Substance Use Topics  . Alcohol use: No  . Drug use: No    Home Medications Prior to Admission  medications   Medication Sig Start Date End Date Taking? Authorizing Provider  albuterol-ipratropium (COMBIVENT) 18-103 MCG/ACT inhaler Inhale 2 puffs into the lungs every 4 (four) hours as needed.     [provider]  cyanocobalamin 500 MCG tablet Take 500 mcg by mouth every morning.    [provider]  diazepam (VALIUM) 5 MG tablet Take 1 tablet (5 mg total) by mouth 2 (two) times daily. 12/01/13   Antony MaduraHumes, Kelly, PA-C  Fluticasone-Salmeterol (ADVAIR DISKUS) 100-50 MCG/DOSE AEPB Inhale 1 puff into the lungs in the morning and at bedtime. 02/09/20   Hyman HopesVenter, Mekhi Sonn, PA-C  Ginkgo Biloba Extract 60 MG CAPS Take 60 mg by mouth every morning.    [provider]  ibuprofen (ADVIL,MOTRIN) 800 MG tablet Take 1 tablet (800 mg total) by mouth 3 (three) times daily. 03/19/16   Cheri Fowlerose, Kayla, PA-C  Ipratropium-Albuterol (COMBIVENT RESPIMAT) 20-100 MCG/ACT AERS respimat Inhale 1 puff into the lungs every 6 (six) hours. 02/09/20   Tanda RockersVenter, Zyere Jiminez, PA-C  oxyCODONE-acetaminophen (PERCOCET/ROXICET) 5-325 MG per tablet Take 1-2 tablets by mouth every 6 (six) hours as needed for moderate pain or severe pain. 12/01/13   Antony MaduraHumes, Kelly, PA-C  vitamin C (ASCORBIC ACID) 500 MG tablet Take 500 mg by mouth every morning.    [provider]    Allergies    Aspirin and Penicillins  Review of Systems   Review of Systems  Constitutional:  Negative for chills and fever.  HENT: Positive for sore throat.   Respiratory: Positive for shortness of breath and wheezing. Negative for cough.   Cardiovascular: Positive for chest pain.  Gastrointestinal: Negative for abdominal pain, nausea and vomiting.  Neurological: Positive for headaches.  All other systems reviewed and are negative.   Physical Exam Updated Vital Signs BP 102/75 (BP Location: Right Arm)   Pulse 76   Temp 98.4 F (36.9 C) (Oral)   Resp 16   Ht 5' 5.5" (1.664 m)   Wt 66.2 kg   LMP 02/02/2020 (Approximate)   SpO2 100%   BMI  23.93 kg/m   Physical Exam Vitals and nursing note reviewed.  Constitutional:      Appearance: She is not ill-appearing or diaphoretic.  HENT:     Head: Normocephalic and atraumatic.     Mouth/Throat:     Mouth: Mucous membranes are moist.     Comments: + post nasal drip Eyes:     Conjunctiva/sclera: Conjunctivae normal.  Cardiovascular:     Rate and Rhythm: Normal rate and regular rhythm.     Pulses: Normal pulses.  Pulmonary:     Effort: Pulmonary effort is normal.     Breath sounds: Normal breath sounds. No wheezing, rhonchi or rales.  Abdominal:     Palpations: Abdomen is soft.     Tenderness: There is no abdominal tenderness. There is no guarding or rebound.  Musculoskeletal:     Cervical back: Neck supple.  Skin:    General: Skin is warm and dry.  Neurological:     General: No focal deficit present.     Mental Status: She is alert and oriented to person, place, and time.     Cranial Nerves: No cranial nerve deficit.     ED Results / Procedures / Treatments   Labs (all labs ordered are listed, but only abnormal results are displayed) Labs Reviewed  SARS CORONAVIRUS 2 BY RT PCR (HOSPITAL ORDER, PERFORMED IN [facility]) - Abnormal; Notable for the following components:      Result Value   SARS Coronavirus 2 POSITIVE (*)    All other components within normal limits  CBC - Abnormal; Notable for the following components:   WBC 3.4 (*)    All other components within normal limits  BASIC METABOLIC PANEL - Abnormal; Notable for the following components:   Calcium 8.8 (*)    All other components within normal limits  TROPONIN I (HIGH SENSITIVITY)  TROPONIN I (HIGH SENSITIVITY)    EKG None  Radiology DG Chest 2 View  Result Date: 02/09/2020 CLINICAL DATA:  Cough EXAM: CHEST - 2 VIEW COMPARISON:  12/30/2013 FINDINGS: The heart size and mediastinal contours are within normal limits. Both lungs are clear. No effusions. No pneumothorax. The visualized  skeletal structures are unremarkable. IMPRESSION: No acute cardiopulmonary disease. Electronically Signed   By: Feliberto Harts MD   On: 02/09/2020 14:30    Procedures Procedures (including critical care time)  Medications Ordered in ED Medications  albuterol (VENTOLIN HFA) 108 (90 Base) MCG/ACT inhaler 2 puff (2 puffs Inhalation Given 02/09/20 1639)    ED Course  I have reviewed the triage vital signs and the nursing notes.  Pertinent labs & imaging results that were available during my care of the patient were reviewed by me and considered in my medical decision making (see chart for details).  Clinical Course as of Feb 08 1921  [facility] Feb 09, 2020  1904 SARS Coronavirus  2(!): POSITIVE [MV]    Clinical Course User Index [MV] Tanda Rockers, PA-C   MDM Rules/Calculators/A&P                          44 year old female who presents to the ED today with multiple complaints after stating that she was exposed to mold in her home last week.  Complaints include chest pain, shortness of breath, headache, wheezing.  She has a history of asthma however states this feels different.  On arrival to the ED patient is afebrile, nontachycardic nontachypneic.  She appears to be in no acute distress.  She is able to speak in full sentences without difficulty.  She is satting 100% on room air.  Her lungs are clear to auscultation bilaterally.  Chest x-ray was obtained will patient was in the waiting room, in no acute abnormalities.  Lab work was obtained as well.  CBC with leukopenia 3.4.  No other findings.  BMP without acute abnormalities.  Given complaint of chest pain and patient age will obtain EKG, troponin.  I have low suspicion for PE at this time as patient denies any pleuritic chest pain and she is nontachycardic.  No other risk factors.  No focal neuro deficits on exam today, denies worst headache of life.  She does report history of headaches in the past however states this feels different.  She  does not currently have a headache.  Do not feel she needs a CT scan at this time.  Given constellation of symptoms and patient being unvaccinated we will plan to test for Covid as well.  Albuterol inhaler ordered as patient has been out for a couple of years.  CXR negative Troponin < 2 COVID test has returned POSITIVE. Pt updated on this; she is instructed to quarantine for 14 days starting today. Work note has been provided. She is requesting refill of her combivent and advair as she has been out. Will oblige. I have discussed MAB with her however patient declines as she is not interested in experimental treatment. Pt instructed on return precautions. She is stable for discharge home.   This note was prepared using Dragon voice recognition software and may include unintentional dictation errors due to the inherent limitations of voice recognition software.  Final Clinical Impression(s) / ED Diagnoses Final diagnoses:  Lab test positive for detection of COVID-19 virus    Rx / DC Orders ED Discharge Orders         Ordered    Fluticasone-Salmeterol (ADVAIR DISKUS) 100-50 MCG/DOSE AEPB  2 times daily        02/09/20 1919    Ipratropium-Albuterol (COMBIVENT RESPIMAT) 20-100 MCG/ACT AERS respimat  Every 6 hours        02/09/20 1919           Discharge Instructions     You have tested POSITIVE for COVID 19 today. This is likely the cause of your chest pains, headache, shortness of breath, and sore throat.   It is recommended that you self isolate for 14 days starting today (cleared: 02/24/20).   Use your inhalers as needed. Drink plenty of fluids to stay hydrated. Take Tylenol as needed if you develop fevers.   Follow up with your PCP regarding your ED visit today.   Return to the ED IMMEDIATELY for any worsening symptoms including worsening chest pains, worsening shortness of breath, lips/fingers turning blue, feeling like you could pass out or if you pass out, or any  new/worsening  symptoms.         Tanda Rockers, PA-C 02/09/20 Reece Agar, MD 02/10/20 [id number]

## 2020-02-09 NOTE — Discharge Instructions (Addendum)
You have tested POSITIVE for COVID 19 today. This is likely the cause of your chest pains, headache, shortness of breath, and sore throat.   It is recommended that you self isolate for 14 days starting today (cleared: 02/24/20).   Use your inhalers as needed. Drink plenty of fluids to stay hydrated. Take Tylenol as needed if you develop fevers.   Follow up with your PCP regarding your ED visit today.   Return to the ED IMMEDIATELY for any worsening symptoms including worsening chest pains, worsening shortness of breath, lips/fingers turning blue, feeling like you could pass out or if you pass out, or any new/worsening symptoms.

## 2020-02-10 ENCOUNTER — Telehealth: Payer: Self-pay | Admitting: Oncology

## 2020-02-10 NOTE — Telephone Encounter (Signed)
Called to Discuss with patient about Covid symptoms and the use of regeneron, a monoclonal antibody infusion for those with mild to moderate Covid symptoms and at a high risk of hospitalization.     Pt is qualified for this infusion at the [facility] infusion center due to co-morbid conditions and/or a member of an at-risk group.      Past Medical History:  Diagnosis Date  . Asthma    Patient decline MAB infusion in ED.   Mignon Pine,  AGNP-C [phone] (Infusion Center Hotline)

## 2022-10-18 ENCOUNTER — Other Ambulatory Visit: Payer: Self-pay | Admitting: Pulmonary Disease

## 2022-10-18 DIAGNOSIS — Z1231 Encounter for screening mammogram for malignant neoplasm of breast: Secondary | ICD-10-CM
